# Patient Record
Sex: Female | Born: 1940 | Race: White | Hispanic: No | Smoking: Former smoker
Health system: Southern US, Community
[De-identification: ages and names within clinical notes are randomized; demographics above are authoritative.]

---

## 2017-06-17 ENCOUNTER — Other Ambulatory Visit (INDEPENDENT_AMBULATORY_CARE_PROVIDER_SITE_OTHER): Payer: Medicare Other

## 2017-06-17 ENCOUNTER — Ambulatory Visit (INDEPENDENT_AMBULATORY_CARE_PROVIDER_SITE_OTHER): Payer: Medicare Other | Admitting: Pulmonary Disease

## 2017-06-17 ENCOUNTER — Telehealth: Payer: Self-pay

## 2017-06-17 ENCOUNTER — Encounter: Payer: Self-pay | Admitting: Pulmonary Disease

## 2017-06-17 VITALS — BP 122/76 | HR 90 | Wt 186.4 lb

## 2017-06-17 DIAGNOSIS — D869 Sarcoidosis, unspecified: Secondary | ICD-10-CM

## 2017-06-17 LAB — COMPREHENSIVE METABOLIC PANEL
ALK PHOS: 58 U/L (ref 39–117)
ALT: 19 U/L (ref 0–35)
AST: 24 U/L (ref 0–37)
Albumin: 4.3 g/dL (ref 3.5–5.2)
BUN: 20 mg/dL (ref 6–23)
CO2: 27 meq/L (ref 19–32)
Calcium: 9.7 mg/dL (ref 8.4–10.5)
Chloride: 100 mEq/L (ref 96–112)
Creatinine, Ser: 0.75 mg/dL (ref 0.40–1.20)
GFR: 79.39 mL/min (ref >60.00)
GLUCOSE: 94 mg/dL (ref 70–99)
POTASSIUM: 3.4 meq/L — AB (ref 3.5–5.1)
SODIUM: 137 meq/L (ref 135–145)
TOTAL PROTEIN: 7.8 g/dL (ref 6.0–8.3)
Total Bilirubin: 1.7 mg/dL — ABNORMAL HIGH (ref 0.2–1.2)

## 2017-06-17 LAB — CBC WITH DIFFERENTIAL/PLATELET
BASOS ABS: 0.1 10*3/uL (ref 0.0–0.1)
Basophils Relative: 1.2 % (ref 0.0–3.0)
EOS PCT: 4.1 % (ref 0.0–5.0)
Eosinophils Absolute: 0.3 10*3/uL (ref 0.0–0.7)
HEMATOCRIT: 44.7 % (ref 36.0–46.0)
HEMOGLOBIN: 14.7 g/dL (ref 12.0–15.0)
Lymphocytes Relative: 20.5 % (ref 12.0–46.0)
Lymphs Abs: 1.4 10*3/uL (ref 0.7–4.0)
MCHC: 33 g/dL (ref 30.0–36.0)
MCV: 94.1 fl (ref 78.0–100.0)
MONOS PCT: 9.5 % (ref 3.0–12.0)
Monocytes Absolute: 0.6 10*3/uL (ref 0.1–1.0)
NEUTROS PCT: 64.7 % (ref 43.0–77.0)
Neutro Abs: 4.3 10*3/uL (ref 1.4–7.7)
Platelets: 259 10*3/uL (ref 150.0–400.0)
RBC: 4.75 Mil/uL (ref 3.87–5.11)
RDW: 14.9 % (ref 11.5–15.5)
WBC: 6.7 10*3/uL (ref 4.0–10.5)

## 2017-06-17 LAB — NITRIC OXIDE: Nitric Oxide: 15

## 2017-06-17 LAB — CK: Total CK: 68 U/L (ref 7–177)

## 2017-06-17 LAB — C-REACTIVE PROTEIN: CRP: 0.2 mg/dL — ABNORMAL LOW (ref 0.5–20.0)

## 2017-06-17 LAB — SEDIMENTATION RATE: Sed Rate: 37 mm/hr — ABNORMAL HIGH (ref 0–30)

## 2017-06-17 MED ORDER — PREDNISONE 10 MG PO TABS: ORAL_TABLET | ORAL | 0 refills | Status: DC

## 2017-06-17 MED ORDER — FLUTICASONE FUROATE-VILANTEROL 200-25 MCG/INH IN AEPB: 1.0000 | INHALATION_SPRAY | Freq: Every day | RESPIRATORY_TRACT | 5 refills | Status: DC

## 2017-06-17 MED ORDER — BENZONATATE 100 MG PO CAPS: 100.0000 mg | ORAL_CAPSULE | Freq: Four times a day (QID) | ORAL | 1 refills | Status: AC | PRN

## 2017-06-17 NOTE — Telephone Encounter (Signed)
Medical record release has been faxed to both Dr. Victorino Sparrow and Dr. Jodelle Gross office requesting records. successful fax confirmation received. Will await records.  Dr.Admad's contact number: [phone] Dr. Doloris Hall contact  Number: [phone]

## 2017-06-17 NOTE — Progress Notes (Signed)
Gabriella Orr    [id number]    [birth year]  Primary Care Physician:Glasscock, Kirke Corin, DO  Referring Physician: No referring provider defined for this encounter.  Chief complaint:  Consult for management of sarcoidosis  HPI: 76 year old with past medical history of sarcoidosis, interstitial lung disease. The diagnosis was made by a right upper lobe CT-guided biopsy in 2011 in [place] which showed noncaseating granuloma. She lives in [place] and has been followed at [facility] clinic with conservative management. The pathology slides have been examined the [facility] clinic and the finding of granulomas have been confirmed. She has not required any prednisone or immunosuppression for sarcoidosis.  She was hospitalized in January 2018 at [place] with respiratory failure and treated with antibiotics. Lung imaging at that time showed groundglass opacities and right lower lobe consolidation. She's had follow-up CT scans which shows persistence of interstitial opacities and chronic lung disease. History noted for superficial thrombus of her leg. CT scans this year did not show any evidence of pulmonary embolism. Since she cannot travel to [place] she is seeking pulmonary care at [facility]  She has been diagnosed with rheumatoid arthritis over the past year and is currently on Arava therapy. Her rheumatologist is Dr. Thompson Grayer in [facility]  Pets: None. No birds, exotic pets, exposure to farm animals Occupation: Retired Print production planner Exposures: No significant exposure, no motivation. Smoking history: 10-pack-year smoking history. Quit in 1979 Travel History: No significant travel history  Outpatient Encounter Prescriptions as of 06/17/2017  Medication Sig  . alendronate (FOSAMAX) 70 MG tablet Take 70 mg by mouth once a week. Take with a full glass of water on an empty stomach.  Marland Kitchen aspirin 325 MG tablet Take 325 mg by mouth daily.  . fluticasone furoate-vilanterol (BREO  ELLIPTA) 100-25 MCG/INH AEPB Inhale 1 puff into the lungs daily.  . folic acid (FOLVITE) 1 MG tablet Take 1 mg by mouth daily.  Marland Kitchen leflunomide (ARAVA) 20 MG tablet Take 20 mg by mouth daily.  Marland Kitchen levothyroxine (SYNTHROID, LEVOTHROID) 150 MCG tablet Take 150 mcg by mouth daily before breakfast.  . losartan-hydrochlorothiazide (HYZAAR) 50-12.5 MG tablet Take 1 tablet by mouth daily.  Marland Kitchen lovastatin (MEVACOR) 20 MG tablet Take 20 mg by mouth at bedtime.   No facility-administered encounter medications on file as of 06/17/2017.     Allergies as of 06/17/2017  . (No Known Allergies)    No past medical history on file.  No past surgical history on file.  No family history on file.  Social History   Social History  . Marital status: Widowed    Spouse name: N/A  . Number of children: N/A  . Years of education: N/A   Occupational History  . Not on file.   Social History Main Topics  . Smoking status: Former Smoker    Quit date: 09/15/1977  . Smokeless tobacco: Never Used  . Alcohol use Not on file  . Drug use: Unknown  . Sexual activity: Not on file   Other Topics Concern  . Not on file   Social History Narrative  . No narrative on file    Review of systems: Review of Systems  Constitutional: Negative for fever and chills.  HENT: Negative.   Eyes: Negative for blurred vision.  Respiratory: as per HPI  Cardiovascular: Negative for chest pain and palpitations.  Gastrointestinal: Negative for vomiting, diarrhea, blood per rectum. Genitourinary: Negative for dysuria, urgency, frequency and hematuria.  Musculoskeletal: Negative for  myalgias, back pain and joint pain.  Skin: Negative for itching and rash.  Neurological: Negative for dizziness, tremors, focal weakness, seizures and loss of consciousness.  Endo/Heme/Allergies: Negative for environmental allergies.  Psychiatric/Behavioral: Negative for depression, suicidal ideas and hallucinations.  All other systems reviewed and  are negative.  Physical Exam: Blood pressure 122/76, pulse 90, weight 84.6 kg (186 lb 6.4 oz), SpO2 92 %. Gen:      No acute distress HEENT:  EOMI, sclera anicteric Neck:     No masses; no thyromegaly Lungs:    Mild expiratory wheeze.; normal respiratory effort CV:         Regular rate and rhythm; no murmurs Abd:      + bowel sounds; soft, non-tender; no palpable masses, no distension Ext:    No edema; adequate peripheral perfusion Skin:      Warm and dry; no rash Neuro: alert and oriented x 3 Psych: normal mood and affect  Data Reviewed:  CT chest 05/22/11-no change in intrathoracic lymphadenopathy, right upper lobe nodular opacity stable, dependent bibasilar atelectasis and subpleural reticulation. CT chest 12/17/11-1.4 into 1.2 centimeter right upper lobe nodule unchanged since 2012, stable subpleural nodular opacities in the right upper and lower lobes. Subpleural reticulation. Chest x-ray 06/10/12-stable lung opacities  Chest x-ray 10/01/16-scattered bilateral interstitial opacities CTA 10/01/16-no pulmonary embolism nodular opacity in the right apex measuring 1.8 centimeter. Right lower lobe mild infiltrate. Mild pulmonary fibrosis. Chest x-ray 11/13/16-coarse reticular and clustered opacities bilaterally worse in the upper to mid lungs. Opacities have worsened since 2013 CT 11/28/16-persistent 1.8 centimeter right upper lobe density. Interstitial thickening with subpleural fibrotic changes. Limited groundglass opacities throughout both lungs. Chest x-ray 12/19/16-right upper and left mid lung interstitial prominence. Chronic interstitial lung markings. Possibly areas of pneumonitis superimposed on chronic lung disease. CTA. 12/19/16-no pulmonary embolism, 15 mm right apical scarring. Bilateral interstitial prominence. With groundglass opacity bilaterally. 1.6 in 20.7 into 2.4 centimeter pleural thickening and superior posterior right lower lobe. Mild enlargement of mediastinal lymph nodes  measuring 14 mm. Compared to 2012 the lymph nodes had decreased in size. 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 Review of her CT scan shows some chronic interstitial opacities and a right upper lobe nodule which has remained stable from 2012.   More recently she's had worsening respiratory issues with new bilateral opacities and groundglass on her CT scan. This could be worsening of sarcoidosis although it could be unusual for her sarcoidosis to flareup after long period of quiescence. I'm more concerned about her recent diagnosis of rheumatoid arthritis and her use of Arava medication We need to consider other interstitial process such as RA ILD, pneumonitis from [place] and also opportunistic infections from immunosuppression. I'll try to get records from Dr. Roney Mans regarding her rheumatoid arthritis. In the meantime we will recheck labs including any reflux, ACE level, ANCA, rheumatoid factor, CCP, sedimentation rate Check beta D glucan and QuantiFERON test to rule out fungal and TB infection She may need a bronchoscopy for further evaluation.  Dyspnea with wheezing. I'll give her a short prednisone taper. We'll increase the breo to 200 and continue Spiriva inhaler. Give Tessalon for cough. Use over-the-counter Mucinex and Delsym for cough Check PFTs and chest x-ray  Plan/Recommendations: - Recheck serologies for ILD - Check beta D glucan quantiferon test - Get records from Dr. Roney Mans, Rheumatology -  Increase Breo to 200, continue Spiriva - Tessalon and mucinex DM - PFTs, CXR  Chilton Greathouse MD [facility] Pager [phone] 06/17/2017, 10:42 AM  CC: No ref. provider found

## 2017-06-17 NOTE — Patient Instructions (Addendum)
We will give you a prednisone taper starting at 40 mg. Reduce dose by 10 mg every 3 days Will increase your Breo to 200, continue Spiriva I'll give you Tessalon Perles to help with the cough You can use Mucinex over-the-counter and Delsym for treatment of cough I will get in touch with your rheumatologist Dr. Ninfa Linden to get more details about your arthritis We will check blood tests today including ANA with reflex, ACE levels, ANCA, RF, CCP, sed rate, CRP, CK, aldolase Comprehensive metabolic panel, CBC Beta D glucan, quantiferon test.   Return in 2 months with PFTs and chest x ray

## 2017-06-18 LAB — ANGIOTENSIN CONVERTING ENZYME: ANGIOTENSIN-CONVERTING ENZYME: 33 U/L (ref 9–67)

## 2017-06-18 NOTE — Telephone Encounter (Signed)
Received records from Dr. Nickolas Madrid office, still awaiting records from Dr. Victorino Sparrow. Records have been placed in PM's cubby for review.

## 2017-06-19 LAB — ANA W/REFLEX: Anti Nuclear Antibody(ANA): NEGATIVE

## 2017-06-22 LAB — QUANTIFERON TB GOLD ASSAY (BLOOD)
QUANTIFERON(R)-TB GOLD: NEGATIVE
Quantiferon Nil Value: 0.06 IU/mL
Quantiferon Tb Ag Minus Nil Value: <0 IU/mL

## 2017-06-22 LAB — ALDOLASE: ALDOLASE: 4.1 U/L (ref <=8.1)

## 2017-06-22 LAB — ANCA SCREEN W REFLEX TITER: ANCA Screen: NEGATIVE

## 2017-06-22 LAB — RHEUMATOID FACTOR: Rhuematoid fact SerPl-aCnc: 196 IU/mL — ABNORMAL HIGH (ref <14)

## 2017-06-22 LAB — CYCLIC CITRUL PEPTIDE ANTIBODY, IGG: Cyclic Citrullin Peptide Ab: <16 UNITS

## 2017-07-03 ENCOUNTER — Institutional Professional Consult (permissible substitution): Payer: Medicare Other | Admitting: Pulmonary Disease

## 2017-07-07 NOTE — Telephone Encounter (Signed)
Lm for Dr. Doloris Hall office in regards to records, as we have not received these.  Will await call back

## 2017-07-09 ENCOUNTER — Telehealth: Payer: Self-pay | Admitting: Pulmonary Disease

## 2017-07-09 DIAGNOSIS — D869 Sarcoidosis, unspecified: Secondary | ICD-10-CM

## 2017-07-09 NOTE — Telephone Encounter (Signed)
Spoke with pt, she states she wants to talk about the medication flucomide. She states PM was supposed to call him. She states her hip is now hurting and she wants both doctors to talk about an alternative. Please call her doctor to discuss. She said you have his number and address. Please advise.   Dr. Ninfa Linden

## 2017-07-13 NOTE — Telephone Encounter (Signed)
PM please advise if anything further is needed.

## 2017-07-13 NOTE — Telephone Encounter (Signed)
I hope to talk with Dr. Ninfa Linden this week. Please let her know I will call her this week after that.

## 2017-07-14 NOTE — Telephone Encounter (Signed)
I called and spoke with Dr. Ninfa Linden, Rheumatology.  We decided to take the patient off Arava since it may be causing the pneumonitis in the lung. She may not need any other RA medication as the arthritis is not active.  I will repeat CT scan.  If there is persistence of groundglass opacities she may need a bronchoscope for further evaluation.  Margie- please order a high-resolution CT to be done on 12/7 before her clinic and PFT appointment.  She will have to drive from [place], [place] so we want to get all the tests done on the same day.  Chilton Greathouse MD [facility] 07/14/2017, 5:39 PM

## 2017-07-17 NOTE — Telephone Encounter (Signed)
Records have been received and placed in PM's cubby for review.  Nothing further needed.  

## 2017-08-21 ENCOUNTER — Ambulatory Visit
Admission: RE | Admit: 2017-08-21 | Discharge: 2017-08-21 | Disposition: A | Discharge status: Home / Self Care | Payer: Self-pay | Source: Ambulatory Visit | Attending: Pulmonary Disease | Admitting: Pulmonary Disease

## 2017-08-21 ENCOUNTER — Ambulatory Visit (INDEPENDENT_AMBULATORY_CARE_PROVIDER_SITE_OTHER)
Admission: RE | Admit: 2017-08-21 | Discharge: 2017-08-21 | Disposition: A | Discharge status: Home / Self Care | Payer: Medicare Other | Source: Ambulatory Visit | Attending: Pulmonary Disease | Admitting: Pulmonary Disease

## 2017-08-21 ENCOUNTER — Ambulatory Visit (INDEPENDENT_AMBULATORY_CARE_PROVIDER_SITE_OTHER): Payer: Medicare Other | Admitting: Pulmonary Disease

## 2017-08-21 ENCOUNTER — Encounter: Payer: Self-pay | Admitting: Pulmonary Disease

## 2017-08-21 ENCOUNTER — Encounter (INDEPENDENT_AMBULATORY_CARE_PROVIDER_SITE_OTHER): Payer: Self-pay

## 2017-08-21 ENCOUNTER — Other Ambulatory Visit: Payer: Self-pay | Admitting: Pulmonary Disease

## 2017-08-21 VITALS — BP 126/68 | HR 97 | Ht 67.0 in | Wt 188.0 lb

## 2017-08-21 DIAGNOSIS — D869 Sarcoidosis, unspecified: Secondary | ICD-10-CM

## 2017-08-21 DIAGNOSIS — M069 Rheumatoid arthritis, unspecified: Secondary | ICD-10-CM | POA: Diagnosis not present

## 2017-08-21 LAB — PULMONARY FUNCTION TEST
DL/VA % pred: 90 %
DL/VA: 4.64 ml/min/mmHg/L
DLCO UNC % PRED: 61 %
DLCO UNC: 17.45 ml/min/mmHg
DLCO cor % pred: 63 %
DLCO cor: 18.03 ml/min/mmHg
FEF 25-75 PRE: 2.61 L/s
FEF 25-75 Post: 2.24 L/sec
FEF2575-%Change-Post: -14 %
FEF2575-%Pred-Post: 134 %
FEF2575-%Pred-Pre: 156 %
FEV1-%CHANGE-POST: 0 %
FEV1-%PRED-POST: 88 %
FEV1-%PRED-PRE: 89 %
FEV1-PRE: 2.05 L
FEV1-Post: 2.03 L
FEV1FVC-%Change-Post: 0 %
FEV1FVC-%PRED-PRE: 119 %
FEV6-%Change-Post: 0 %
FEV6-%PRED-POST: 78 %
FEV6-%PRED-PRE: 79 %
FEV6-POST: 2.28 L
FEV6-PRE: 2.3 L
FEV6FVC-%PRED-POST: 105 %
FEV6FVC-%PRED-PRE: 105 %
FVC-%CHANGE-POST: -1 %
FVC-%PRED-PRE: 76 %
FVC-%Pred-Post: 75 %
FVC-POST: 2.29 L
FVC-Pre: 2.33 L
POST FEV6/FVC RATIO: 100 %
PRE FEV6/FVC RATIO: 100 %
Post FEV1/FVC ratio: 89 %
Pre FEV1/FVC ratio: 88 %
RV % PRED: 91 %
RV: 2.3 L
TLC % PRED: 86 %
TLC: 4.73 L

## 2017-08-21 MED ORDER — BENZONATATE 200 MG PO CAPS: 200.0000 mg | ORAL_CAPSULE | Freq: Three times a day (TID) | ORAL | 7 refills | Status: AC | PRN

## 2017-08-21 MED ORDER — FLUTICASONE FUROATE-VILANTEROL 200-25 MCG/INH IN AEPB: 1.0000 | INHALATION_SPRAY | Freq: Every day | RESPIRATORY_TRACT | 0 refills | Status: AC

## 2017-08-21 NOTE — Patient Instructions (Signed)
I am glad that her breathing is better.  Please continue the Breo and Spiriva We will follow-up in 6 months with a CT scan without contrast

## 2017-08-21 NOTE — Progress Notes (Signed)
Gabriella Orr    [id number]    1941-07-07  Primary Care Physician:Glasscock, Kirke Corin, DO  Referring Physician: Manuella Ghazi, DO [address] [place], [place] [address]  Chief complaint:   Follow up for Sarcoidosis RA- ILD  HPI: 76 year old with past medical history of sarcoidosis, interstitial lung disease. The diagnosis was made by a right upper lobe CT-guided biopsy in 2011 in [place] which showed noncaseating granuloma. She lives in [place] and has been followed at [facility] clinic with conservative management. The pathology slides have been examined the [facility] clinic and the finding of granulomas have been confirmed. She has not required any prednisone or immunosuppression for sarcoidosis.  She was hospitalized in January 2018 at [place] with respiratory failure and treated with antibiotics. Lung imaging at that time showed groundglass opacities and right lower lobe consolidation. She's had follow-up CT scans which shows persistence of interstitial opacities and chronic lung disease. History noted for superficial thrombus of her leg. CT scans this year did not show any evidence of pulmonary embolism. Since she cannot travel to [place] she is seeking pulmonary care at [facility]. She has been diagnosed with rheumatoid arthritis over the past year and is on Arava therapy since early 2018. Her rheumatologist is Dr. Thompson Grayer in [facility]  Pets: None. No birds, exotic pets, exposure to farm animals Occupation: Retired Print production planner Exposures: No significant exposure, no motivation. Smoking history: 10-pack-year smoking history. Quit in 1979 Travel History: No significant travel history.  Interim history: Discussed care with Dr. Tasia Catchings, rheumatologist in [place].  We decided to hold arava therapy given the finding of GGO on CT scan.  She was given a short prednisone taper at last visit.  She continues on Brio and Spiriva.  She reports  improving respiratory symptoms with less cough. She still has some congestion, dyspnea on exertion.  Outpatient Encounter Medications as of 08/21/2017  Medication Sig  . alendronate (FOSAMAX) 70 MG tablet Take 70 mg by mouth once a week. Take with a full glass of water on an empty stomach.  Marland Kitchen aspirin 325 MG tablet Take 325 mg by mouth daily.  . benzonatate (TESSALON) 100 MG capsule Take 1 capsule (100 mg total) by mouth every 6 (six) hours as needed for cough.  . fluticasone furoate-vilanterol (BREO ELLIPTA) 200-25 MCG/INH AEPB Inhale 1 puff into the lungs daily.  . folic acid (FOLVITE) 1 MG tablet Take 1 mg by mouth daily.  Marland Kitchen levothyroxine (SYNTHROID, LEVOTHROID) 150 MCG tablet Take 150 mcg by mouth daily before breakfast.  . losartan-hydrochlorothiazide (HYZAAR) 50-12.5 MG tablet Take 1 tablet by mouth daily.  Marland Kitchen lovastatin (MEVACOR) 20 MG tablet Take 20 mg by mouth at bedtime.  . [DISCONTINUED] leflunomide (ARAVA) 20 MG tablet Take 20 mg by mouth daily.  . [DISCONTINUED] predniSONE (DELTASONE) 10 MG tablet 4 tabs x3 days, 3 tabs x 3 days, 2 tabs x 3 days, 1 tab x 3 days then stop (Patient not taking: Reported on 08/21/2017)   No facility-administered encounter medications on file as of 08/21/2017.     Allergies as of 08/21/2017  . (No Known Allergies)   No past medical history on file.  History reviewed. No pertinent surgical history.  No family history on file.  Social History   Socioeconomic History  . Marital status: Widowed    Spouse name: Not on file  . Number of children: Not on file  . Years of education: Not on file  .  Highest education level: Not on file  Social Needs  . Financial resource strain: Not on file  . Food insecurity - worry: Not on file  . Food insecurity - inability: Not on file  . Transportation needs - medical: Not on file  . Transportation needs - non-medical: Not on file  Occupational History  . Not on file  Tobacco Use  . Smoking status: Former  Smoker    Last attempt to quit: 09/15/1977    Years since quitting: 39.9  . Smokeless tobacco: Never Used  Substance and Sexual Activity  . Alcohol use: Not on file  . Drug use: Not on file  . Sexual activity: Not on file  Other Topics Concern  . Not on file  Social History Narrative  . Not on file    Review of systems: Review of Systems  Constitutional: Negative for fever and chills.  HENT: Negative.   Eyes: Negative for blurred vision.  Respiratory: as per HPI  Cardiovascular: Negative for chest pain and palpitations.  Gastrointestinal: Negative for vomiting, diarrhea, blood per rectum. Genitourinary: Negative for dysuria, urgency, frequency and hematuria.  Musculoskeletal: Negative for myalgias, back pain and joint pain.  Skin: Negative for itching and rash.  Neurological: Negative for dizziness, tremors, focal weakness, seizures and loss of consciousness.  Endo/Heme/Allergies: Negative for environmental allergies.  Psychiatric/Behavioral: Negative for depression, suicidal ideas and hallucinations.  All other systems reviewed and are negative.  Physical Exam: Blood pressure 126/68, pulse 97, height 5\' 7"  (1.702 m), weight 188 lb (85.3 kg), SpO2 (!) 68 %. Gen:      No acute distress HEENT:  EOMI, sclera anicteric Neck:     No masses; no thyromegaly Lungs:    Clear to auscultation bilaterally; normal respiratory effort CV:         Regular rate and rhythm; no murmurs Abd:      + bowel sounds; soft, non-tender; no palpable masses, no distension Ext:    No edema; adequate peripheral perfusion Skin:      Warm and dry; no rash Neuro: alert and oriented x 3 Psych: normal mood and affect  Data Reviewed: CT chest 05/22/11-no change in intrathoracic lymphadenopathy, right upper lobe nodular opacity stable, dependent bibasilar atelectasis and subpleural reticulation. CT chest 12/17/11-1.4 into 1.2 centimeter right upper lobe nodule unchanged since 2012, stable subpleural nodular  opacities in the right upper and lower lobes. Subpleural reticulation. Chest x-ray 06/10/12-stable lung opacities  Chest x-ray 10/01/16-scattered bilateral interstitial opacities CTA 10/01/16-no pulmonary embolism nodular opacity in the right apex measuring 1.8 centimeter. Right lower lobe mild infiltrate. Mild pulmonary fibrosis. Chest x-ray 11/13/16-coarse reticular and clustered opacities bilaterally worse in the upper to mid lungs. Opacities have worsened since 2013 CT 11/28/16-persistent 1.8 centimeter right upper lobe density. Interstitial thickening with subpleural fibrotic changes. Limited groundglass opacities throughout both lungs. Chest x-ray 12/19/16-right upper and left mid lung interstitial prominence. Chronic interstitial lung markings. Possibly areas of pneumonitis superimposed on chronic lung disease. CTA. 12/19/16-no pulmonary embolism, 15 mm right apical scarring. Bilateral interstitial prominence. With groundglass opacity bilaterally. 1.6 in 20.7 into 2.4 centimeter pleural thickening and superior posterior right lower lobe. Mild enlargement of mediastinal lymph nodes measuring 14 mm. Compared to 2012 the lymph nodes had decreased in size.  High-resolution CT 08/21/17-stable right upper lobe/apical scarring.  Improvement in groundglass opacities.  Stable pleural thickening with nodular opacity in the right lower lobe. I reviewed all images personally   FENO 06/17/17-15 CBC 06/17/17-WBC 6.7, eosinophil 4.1%. Serologies 06/17/17-ANA negative, ACE  level-33 CCP <16, rheumatoid factor 196.  PFTs 08/21/17- FVC 2.29 [75%], FEV1 2.03 [88%], F/F 89, TLC 86%, DLCO 61%.  DLCO/VA 90%. Moderate diffusion defect which corrects for alveolar volume.  Assessment:  Follow-up for pulmonary sarcoidosis RA-ILD Sarcoidosis was diagnosed with a biopsy in 2011. She also has a complicated history with rheumatoid arthritis and use of Arava medication.      More recently she's had worsening respiratory issues  with new bilateral opacities and groundglass on her CT scan in early 2018.  I do not suspect worsening of sarcoid.  Her acute worsening is likely secondary to infectious pneumonia versus pneumonitis from RA ILD or Arava. Review of her CT scan today shows some chronic interstitial opacities and a right upper lobe nodule which has remained stable from 2012.  There is improvement in her groundglass opacities and inflammation.  She is symptomatically better we will hold off on bronchoscopy for now.  Follow-up with CT scan in 6 months.  Continue inhalers including Breo and Spiriva. She is off [place]. Joint symptoms are stable.  Lung nodules There is chronic pleural thickening with pleural-based right lower lobe mass which is stable from 2018.  We will continue to follow this on follow-up CT.   Plan/Recommendations: - Continue Breo, spriva - Follow CT without contrast in 6 months  Chilton Greathouse MD [facility] Pager [phone] 08/21/2017, 11:23 AM  CC: Rowland Lathe D, DO

## 2017-08-21 NOTE — Progress Notes (Signed)
PFT done today. 

## 2017-08-23 ENCOUNTER — Encounter: Payer: Self-pay | Admitting: Pulmonary Disease

## 2017-09-29 ENCOUNTER — Telehealth: Payer: Self-pay | Admitting: Pulmonary Disease

## 2017-09-29 MED ORDER — FLUTICASONE FUROATE-VILANTEROL 200-25 MCG/INH IN AEPB: 1.0000 | INHALATION_SPRAY | Freq: Every day | RESPIRATORY_TRACT | 5 refills | Status: AC

## 2017-09-29 NOTE — Telephone Encounter (Signed)
ATC pt, no answer. Left message for pt to call back.  We do not have any coupons for [facility], they are expired. I sent the Rx to her pharmacy.

## 2017-09-30 NOTE — Telephone Encounter (Signed)
Pt aware and nothing further needed 

## 2018-04-23 ENCOUNTER — Ambulatory Visit: Payer: Medicare Other | Admitting: Pulmonary Disease

## 2018-08-22 IMAGING — CT CT CHEST HIGH RESOLUTION W/O CM
2 of 6 series · 14 of 36 positions shown, 17 images · non-contrast
Comparison: None.

CLINICAL DATA: Rheumatoid arthritis, shortness of breath, sarcoid.

EXAM:
CT CHEST WITHOUT CONTRAST
TECHNIQUE: Multidetector CT imaging of the chest was performed following the
standard protocol without intravenous contrast. High resolution
imaging of the lungs, as well as inspiratory and expiratory imaging,
was performed.

[Series 2: high resolution · axial · 0.69mm/px · z∈[-340,-66]mm · 11 of 155 slices shown, 14 images]
[im 9/155  mediastinal]
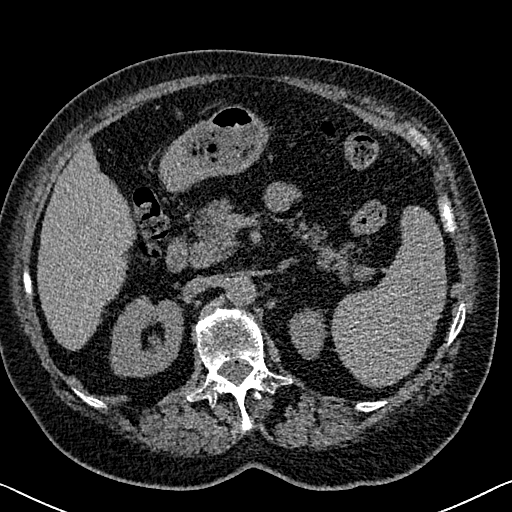
[im 9/155  lung]
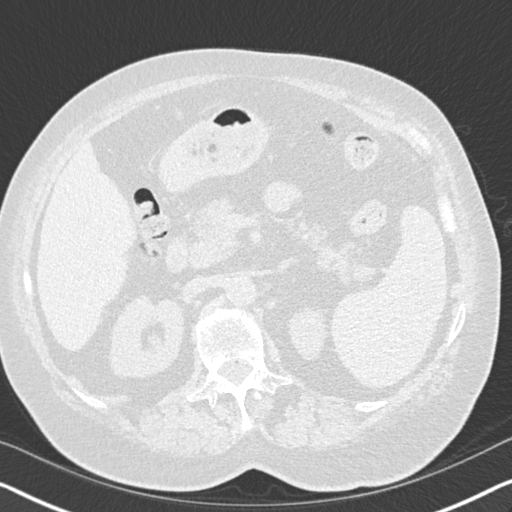
[im 25/155  lung]
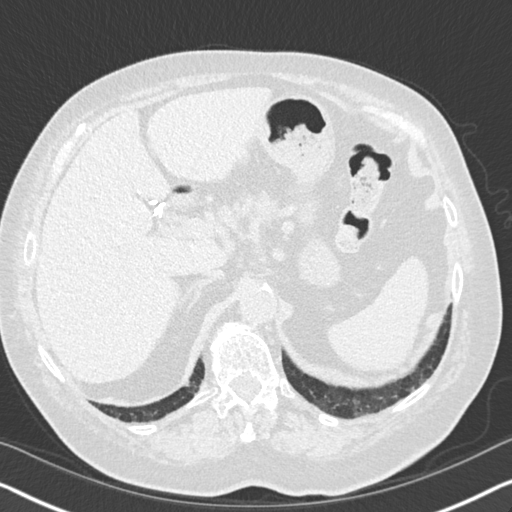
[im 41/155  lung]
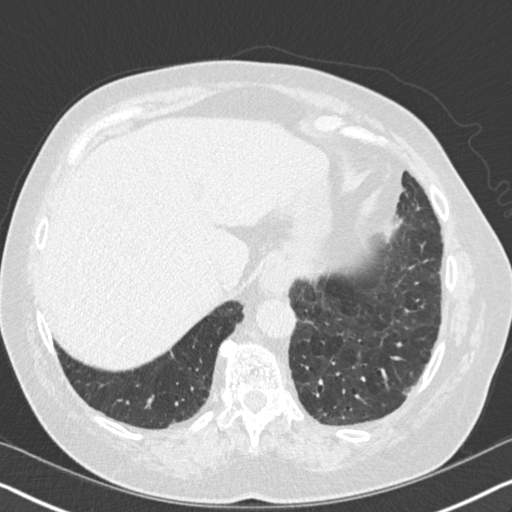
[im 49/155  lung]
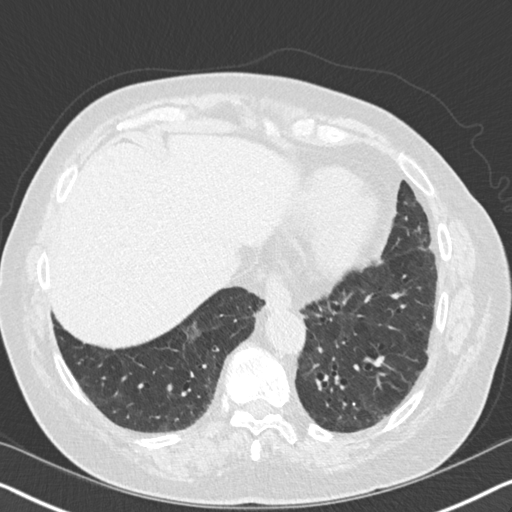
[im 65/155  mediastinal]
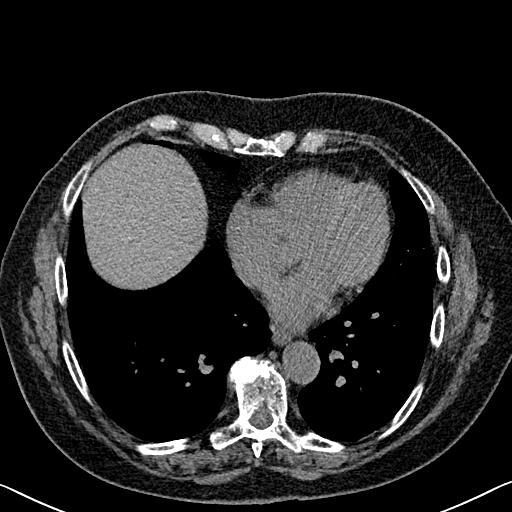
[im 65/155  lung]
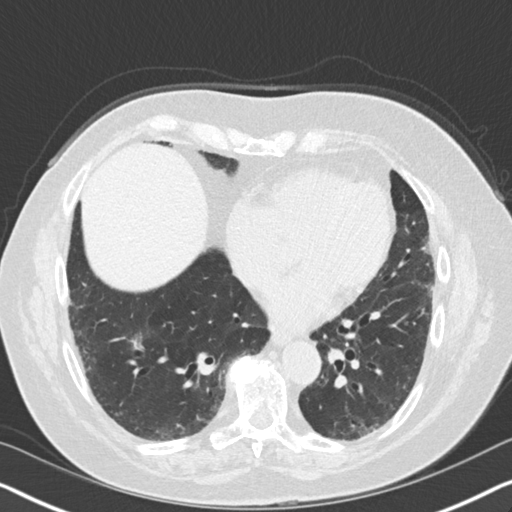
[im 82/155  lung]
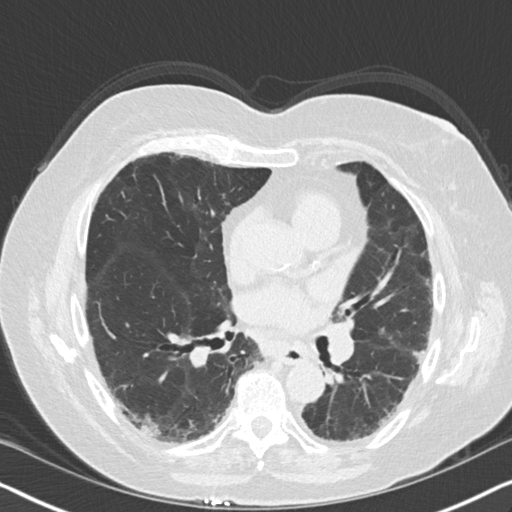
[im 90/155  lung]
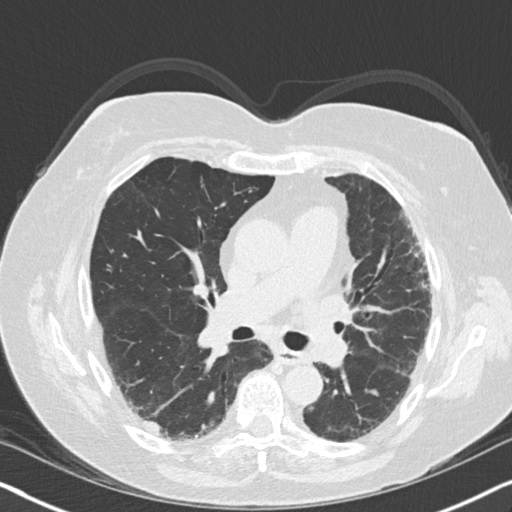
[im 106/155  lung]
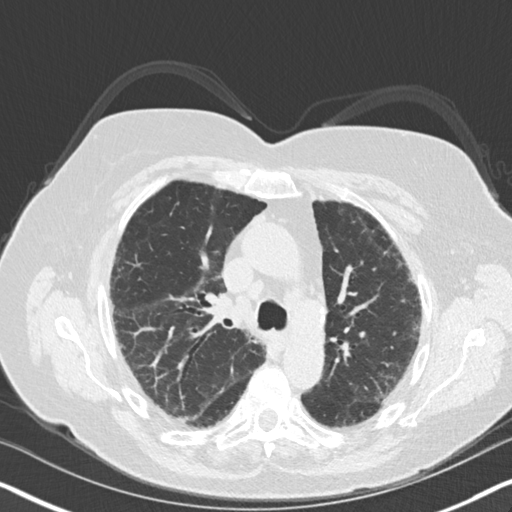
[im 114/155  mediastinal]
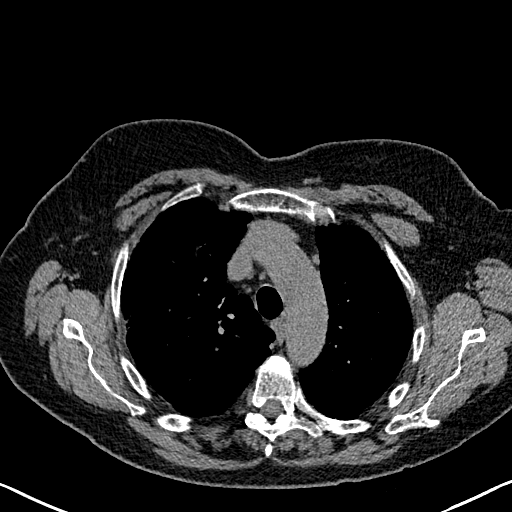
[im 114/155  lung]
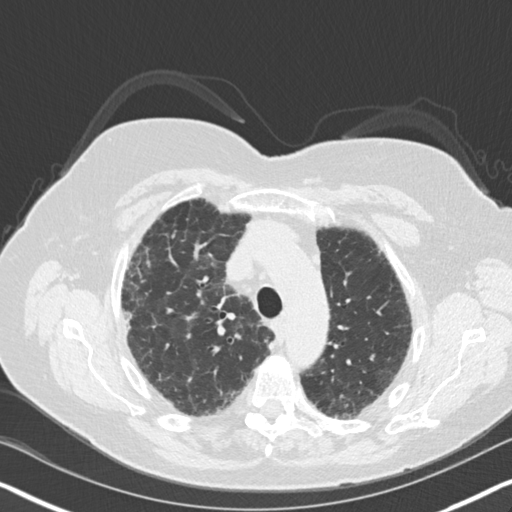
[im 130/155  lung]
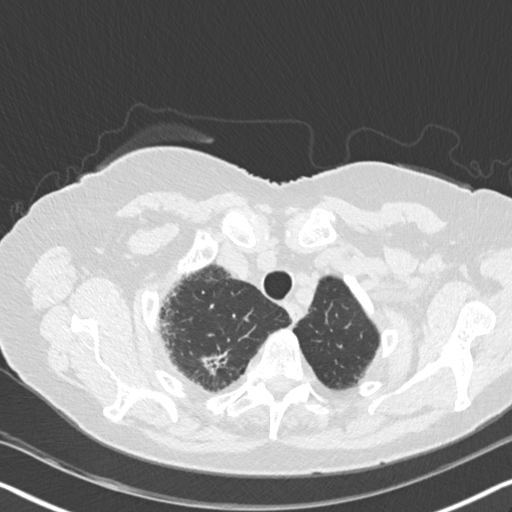
[im 146/155  lung]
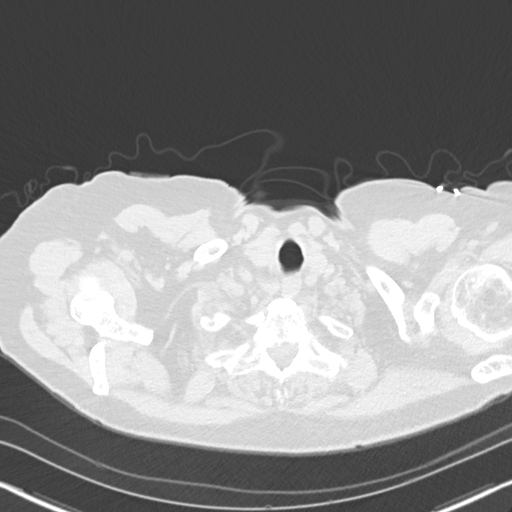

[Series 8: coronal · coronal · 0.61mm/px · 3 of 120 slices shown]
[im 24/120  lung]
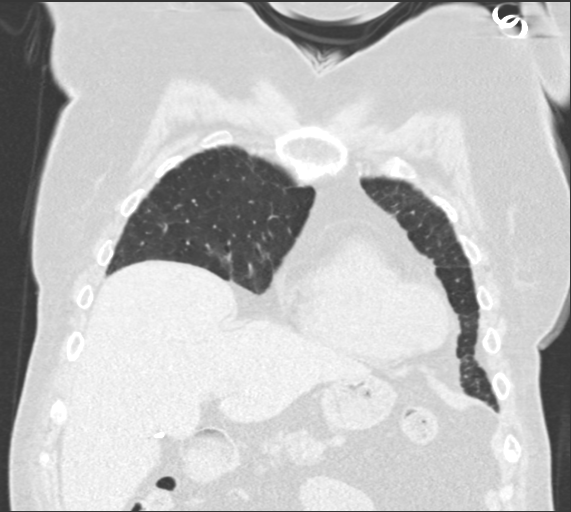
[im 48/120  lung]
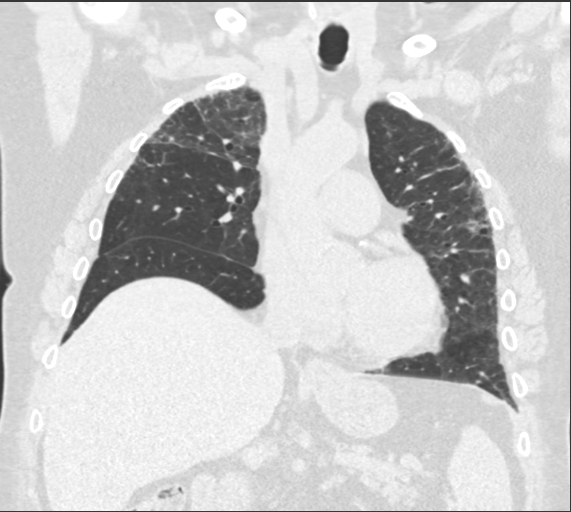
[im 72/120  lung]
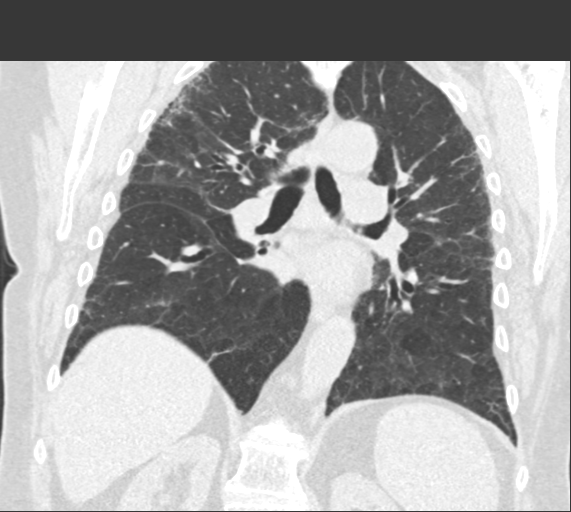

[14 of 36 positions shown; findings below may reference images not displayed]

FINDINGS: Cardiovascular: Atherosclerotic calcification of the arterial
vasculature, including mild-to-moderate involvement of the coronary
arteries. Pulmonary arteries are enlarged. Heart is at the upper
limits of normal in size. No pericardial effusion.

Mediastinum/Nodes: Mediastinal lymph nodes measure up to 1.5 cm in
the low right paratracheal station. Hilar regions are difficult to
definitively evaluate without IV contrast. No axillary adenopathy.
Esophagus is grossly unremarkable.

Lungs/Pleura: An irregular nodular area of consolidation and
bronchiectasis is seen in the apical segment right upper lobe, 1.5 x
1.6 cm (series 3, image 26). Subpleural nodular consolidation in the
right lower lobe measures 1.0 x 2.2 cm (image 70). Finally, an
irregular nodular lesion more inferiorly in the right lower lobe
measures 7 x 10 mm (image 92). There is an upper/midlung zone
predominant pattern of interstitial ground-glass, coarsening,
subpleural reticulation and traction bronchiolectasis. No pleural
fluid. Airway is unremarkable. Mild air trapping.

Upper Abdomen: Visualized portions of the liver and adrenal glands
are unremarkable. Subcentimeter hyperattenuating and hypoattenuating
lesions in the right kidney are too small to characterize. Spleen
appears prominent but is incompletely imaged. Visualized portions of
the pancreas, stomach and bowel are grossly unremarkable with the
exception of a small hiatal hernia. Upper abdominal lymph nodes are
not enlarged by CT size criteria.

Musculoskeletal: Degenerative changes in the spine. No worrisome
lytic or sclerotic lesions.
IMPRESSION: 1. Pulmonary parenchymal pattern of fibrosis is suggestive of
fibrotic nonspecific interstitial pneumonitis. No specific features
of sarcoid.
2. Areas of ground-glass or solid nodularity in the right lung.
Adenocarcinoma cannot be excluded. Follow-up CT chest without
contrast in 3 months is recommended in further initial evaluation.
This recommendation follows the consensus statement: Guidelines for
Management of Small Pulmonary Nodules Detected on CT Images: From
the [HOSPITAL] 7596; Radiology 7596; [DATE].
3. Aortic atherosclerosis (C9PM7-170.0). Mild-to-moderate coronary
artery calcification.
4. Enlarged pulmonary arteries, indicative of pulmonary arterial
hypertension.
5. Mediastinal adenopathy, likely related to interstitial lung
disease or the patient's history of sarcoid.

## 2018-08-23 ENCOUNTER — Other Ambulatory Visit: Payer: Self-pay | Admitting: Pulmonary Disease
